# Patient Record
Sex: Male | Born: 1995
Health system: Southern US, Community
[De-identification: ages and names within clinical notes are randomized; demographics above are authoritative.]

## PROBLEM LIST (undated history)

## (undated) DIAGNOSIS — R011 Cardiac murmur, unspecified: Secondary | ICD-10-CM

## (undated) DIAGNOSIS — J45909 Unspecified asthma, uncomplicated: Secondary | ICD-10-CM

## (undated) DIAGNOSIS — T7840XA Allergy, unspecified, initial encounter: Secondary | ICD-10-CM

## (undated) HISTORY — PX: TONSILLECTOMY: SUR1361

## (undated) HISTORY — DX: Allergy, unspecified, initial encounter: T78.40XA

---
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
## 2015-04-18 ENCOUNTER — Emergency Department (HOSPITAL_COMMUNITY)
Admission: EM | Admit: 2015-04-18 | Discharge: 2015-04-18 | Disposition: A | Discharge status: Home / Self Care | Payer: BLUE CROSS/BLUE SHIELD | Attending: Emergency Medicine | Admitting: Emergency Medicine

## 2015-04-18 ENCOUNTER — Emergency Department (HOSPITAL_COMMUNITY): Payer: BLUE CROSS/BLUE SHIELD

## 2015-04-18 ENCOUNTER — Encounter (HOSPITAL_COMMUNITY): Payer: Self-pay | Admitting: Emergency Medicine

## 2015-04-18 DIAGNOSIS — R0981 Nasal congestion: Secondary | ICD-10-CM | POA: Diagnosis not present

## 2015-04-18 DIAGNOSIS — R011 Cardiac murmur, unspecified: Secondary | ICD-10-CM | POA: Diagnosis not present

## 2015-04-18 DIAGNOSIS — H6691 Otitis media, unspecified, right ear: Secondary | ICD-10-CM | POA: Insufficient documentation

## 2015-04-18 DIAGNOSIS — R05 Cough: Secondary | ICD-10-CM | POA: Diagnosis not present

## 2015-04-18 DIAGNOSIS — J45909 Unspecified asthma, uncomplicated: Secondary | ICD-10-CM | POA: Diagnosis not present

## 2015-04-18 DIAGNOSIS — H9201 Otalgia, right ear: Secondary | ICD-10-CM | POA: Diagnosis present

## 2015-04-18 MED ORDER — AMOXICILLIN 500 MG PO CAPS: 500.0000 mg | ORAL_CAPSULE | Freq: Three times a day (TID) | ORAL | Status: DC

## 2015-04-18 NOTE — ED Provider Notes (Signed)
CSN: [id number]     Arrival date & time 04/18/15  1446 History  By signing my name below, I, Todd Sparks, attest that this documentation has been prepared under the direction and in the presence of Todd Sparks, Beckett RidgePA-C. Electronically Signed: Placido SouLogan Sparks, ED Scribe. 04/18/2015. 4:32 PM.    Chief Complaint  Patient presents with  . Cough   The history is provided by the patient. No language interpreter was used.    HPI Comments: Todd Sparks is a 20 y.o. male with a hx of asthma who presents to the Emergency Department complaining of a constant, moderate, right ear pain with onset last night. Pt notes associated cold like symptoms which began about 1 week ago including an unproductive cough, chest congestion and post nasal drip. He denies any hx of smoking but endorses smoke exposure due to working in [employer]. Pt denies any known drug allergies. He denies fevers or other associated symptoms at this time.    Past Medical History  Diagnosis Date  . Allergy   . Murmur, heart     [facility] states [facility] [place] 1st year of life due to murmur  . Asthma    History reviewed. No pertinent past surgical history. No family history on file. Social History  Substance Use Topics  . Smoking status: Never Smoker   . Smokeless tobacco: Never Used  . Alcohol Use: No    Review of Systems  Constitutional: Negative for fever.  HENT: Positive for congestion, ear pain, hearing loss and postnasal drip.   All other systems reviewed and are negative.   Allergies  Bee venom  Home Medications   Prior to Admission medications   Medication Sig Start Date End Date Taking? Authorizing Provider  azithromycin (ZITHROMAX Z-PAK) 250 MG tablet As directed 07/06/14   Mary-Margaret Daphine DeutscherMartin, FNP  benzonatate (TESSALON) 200 MG capsule Take 1 capsule (200 mg total) by mouth 3 (three) times daily as needed. 07/01/14   Junie Spencerhristy A Hawks, FNP  HYDROcodone-homatropine  (HYCODAN) 5-1.5 MG/5ML syrup Take 5 mLs by mouth every 8 (eight) hours as needed for cough. 07/01/14   Christy A Hawks, FNP   BP 154/83 mmHg  Pulse 60  Temp(Src) 98 F (36.7 C) (Oral)  Resp 18  Ht 5\' 5"  (1.651 m)  Wt 160 lb (72.576 kg)  BMI 26.63 kg/m2  SpO2 97%    Physical Exam  Constitutional: He is oriented to person, place, and time. He appears well-developed and well-nourished.  HENT:  Head: Normocephalic and atraumatic.  Right Ear: Tympanic membrane is erythematous and bulging.  Left Ear: Hearing, tympanic membrane, external ear and ear canal normal.  Mouth/Throat: No oropharyngeal exudate.  Neck: Normal range of motion. No tracheal deviation present.  Cardiovascular: Normal rate.   Pulmonary/Chest: Effort normal and breath sounds normal. No respiratory distress. He has no wheezes. He has no rales.  Abdominal: Soft. There is no tenderness.  Musculoskeletal: Normal range of motion.  Neurological: He is alert and oriented to person, place, and time.  Skin: Skin is warm and dry. He is not diaphoretic.  Psychiatric: He has a normal mood and affect. His behavior is normal.  Nursing note and vitals reviewed.   ED Course  Procedures  DIAGNOSTIC STUDIES: Oxygen Saturation is 97% on RA, normal by my interpretation.    COORDINATION OF CARE: 4:28 PM Pt presents today due to right ear pain. Discussed next steps with pt and he agreed to the plan.  Labs Review Labs Reviewed - No data to display  Imaging Review Dg Chest 2 View  04/18/2015  CLINICAL DATA:  COUGH, Pt reports cough, ear pain, chest congestion, and pain with coughing. Denies fever. HISTORY OF ALLERGIES, HEART MURMUR, ASTHMA EXAM: CHEST  2 VIEW COMPARISON:  07/06/2014 FINDINGS: Midline trachea. Normal heart size and mediastinal contours. Mild right hemidiaphragm elevation. No pleural effusion or pneumothorax. Clearing of retrocardiac left lower lobe airspace disease. Clear lungs. IMPRESSION: No acute cardiopulmonary  disease. Electronically Signed   By: Todd Sparks M.D.   On: 04/18/2015 15:09   I have personally reviewed and evaluated these images as part of my medical decision-making.   EKG Interpretation None      MDM   Final diagnoses:  Acute right otitis media, recurrence not specified, unspecified otitis media type   . Meds ordered this encounter  Medications  . amoxicillin (AMOXIL) 500 MG capsule    Sig: Take 1 capsule (500 mg total) by mouth 3 (three) times daily.    Dispense:  30 capsule    Refill:  0    Order Specific Question:  Supervising Provider    Answer:  Todd Sparks [[id number]]   An After Visit Summary was printed and given to the patient.  I personally performed the services in this documentation, which was scribed in my presence.  The recorded information has been reviewed and considered.   Todd Sparks.  Todd Sparks [place], PA-C 04/18/15 1635  Todd Bale, MD 04/19/15 1140

## 2015-04-18 NOTE — ED Notes (Addendum)
Pt reports cough, ear pain, chest congestion, and pain with coughing. Denies fever.

## 2015-04-18 NOTE — Discharge Instructions (Signed)

## 2015-05-14 ENCOUNTER — Ambulatory Visit (INDEPENDENT_AMBULATORY_CARE_PROVIDER_SITE_OTHER): Payer: BLUE CROSS/BLUE SHIELD | Admitting: Nurse Practitioner

## 2015-05-14 ENCOUNTER — Encounter: Payer: Self-pay | Admitting: Nurse Practitioner

## 2015-05-14 DIAGNOSIS — H65191 Other acute nonsuppurative otitis media, right ear: Secondary | ICD-10-CM | POA: Diagnosis not present

## 2015-05-14 MED ORDER — CEFDINIR 300 MG PO CAPS: 300.0000 mg | ORAL_CAPSULE | Freq: Two times a day (BID) | ORAL | Status: DC

## 2015-05-14 NOTE — Patient Instructions (Signed)

## 2015-05-14 NOTE — Progress Notes (Signed)
   Subjective:    Patient ID: Todd Sparks, male    DOB: 10/24/1995, 20 y.o.   MRN: [id number]  HPI  Patient in c/o right ear pain- in January 1,2017 went to [facility] and was treated with amoxicillin- got better- started hurting again this past Monday- hearing is muffled. No ear drainage.   Review of Systems  Constitutional: Negative for fever and chills.  HENT: Negative.   Respiratory: Negative.   Cardiovascular: Negative.   Gastrointestinal: Negative.   Genitourinary: Negative.   Neurological: Positive for dizziness (occasional).  Psychiatric/Behavioral: Negative.   All other systems reviewed and are negative.      Objective:   Physical Exam  Constitutional: He appears well-developed and well-nourished.  HENT:  Right Ear: Tympanic membrane is erythematous and bulging. A middle ear effusion is present.  Left Ear: Hearing, tympanic membrane, external ear and ear canal normal.  Nose: Nose normal.  Mouth/Throat: Uvula is midline, oropharynx is clear and moist and mucous membranes are normal.  Cardiovascular: Normal rate, regular rhythm and normal heart sounds.   Pulmonary/Chest: Effort normal and breath sounds normal.   BP 115/72 mmHg  Pulse 57  Temp(Src) 98 F (36.7 C) (Oral)  Ht  (1.651 m)  Wt 155 lb (70.308 kg)  BMI 25.79 kg/m2       Assessment & Plan:  1 Otiitis Media right heating pad will help motirn or tylenol OTC RTO prn - cefdinir (OMNICEF) 300 MG capsule; Take 1 capsule (300 mg total) by mouth 2 (two) times daily. 1 po BID  Dispense: 20 capsule; Refill: 0   Mary-Margaret Daphine Deutscher, FNP

## 2015-07-05 ENCOUNTER — Ambulatory Visit (INDEPENDENT_AMBULATORY_CARE_PROVIDER_SITE_OTHER): Payer: BLUE CROSS/BLUE SHIELD | Admitting: Nurse Practitioner

## 2015-07-05 ENCOUNTER — Ambulatory Visit (INDEPENDENT_AMBULATORY_CARE_PROVIDER_SITE_OTHER): Payer: BLUE CROSS/BLUE SHIELD

## 2015-07-05 VITALS — BP 128/79 | HR 75 | Temp 97.8°F | Ht 65.0 in | Wt 186.0 lb

## 2015-07-05 DIAGNOSIS — R059 Cough, unspecified: Secondary | ICD-10-CM

## 2015-07-05 DIAGNOSIS — R05 Cough: Secondary | ICD-10-CM

## 2015-07-05 DIAGNOSIS — R0600 Dyspnea, unspecified: Secondary | ICD-10-CM | POA: Diagnosis not present

## 2015-07-05 DIAGNOSIS — R071 Chest pain on breathing: Secondary | ICD-10-CM

## 2015-07-05 DIAGNOSIS — R079 Chest pain, unspecified: Secondary | ICD-10-CM

## 2015-07-05 DIAGNOSIS — R0789 Other chest pain: Secondary | ICD-10-CM

## 2015-07-05 MED ORDER — NAPROXEN 500 MG PO TABS: 500.0000 mg | ORAL_TABLET | Freq: Two times a day (BID) | ORAL | Status: DC

## 2015-07-05 NOTE — Progress Notes (Signed)
   Subjective:    Patient ID: Todd Sparks, male    DOB: 01-Nov-1995, 20 y.o.   MRN: [id number]  HPI Patient woke up this morning with chest pains- has history of asthma and thought was from that. Chest pain is pressure like something is sittting on chest and radiates into back. Some SOB- no fever, cough or congestion.    Review of Systems  Constitutional: Negative.  Negative for fever, chills and appetite change.  HENT: Negative.  Negative for congestion, ear pain, sinus pressure, trouble swallowing and voice change.   Respiratory: Positive for shortness of breath. Negative for cough and wheezing.   Cardiovascular: Positive for chest pain. Negative for palpitations and leg swelling.  Gastrointestinal: Negative.   Genitourinary: Negative.   Neurological: Negative.   Psychiatric/Behavioral: Negative.   All other systems reviewed and are negative.      Objective:   Physical Exam  Constitutional: He is oriented to person, place, and time. He appears well-developed and well-nourished. No distress.  Cardiovascular: Normal rate, regular rhythm and normal heart sounds.   Pulmonary/Chest: Effort normal and breath sounds normal.  Musculoskeletal:  No chest pain on palpation  Neurological: He is alert and oriented to person, place, and time.  Skin: Skin is warm and dry.  Psychiatric: He has a normal mood and affect. His behavior is normal. Judgment and thought content normal.    BP 128/79 mmHg  Pulse 75  Temp(Src) 97.8 F (36.6 C) (Oral)  Ht 5\' 5"  (1.651 m)  Wt 186 lb (84.369 kg)  BMI 30.95 kg/m2  Chest x ray- normal-Preliminary reading by Paulene FloorMary Ferris Tally, FNP  [facility] EKG-     Assessment & Plan:   1. Dyspnea   2. Cough   3. Chest pain, unspecified chest pain type   4. Costochondral pain    Meds ordered this encounter  Medications  . naproxen (NAPROSYN) 500 MG tablet    Sig: Take 1 tablet (500 mg total) by mouth 2 (two) times daily with a meal.    Dispense:  60 tablet   Refill:  1    Order Specific Question:  Supervising Provider    Answer:  Ernestina PennaMOORE, DONALD W [[id number]]   Rest No heavy liftiong RTO prn  Mary-Margaret Daphine DeutscherMartin, FNP

## 2015-07-05 NOTE — Patient Instructions (Signed)

## 2015-09-09 ENCOUNTER — Encounter: Payer: Self-pay | Admitting: Family Medicine

## 2015-09-09 ENCOUNTER — Ambulatory Visit (INDEPENDENT_AMBULATORY_CARE_PROVIDER_SITE_OTHER): Payer: BLUE CROSS/BLUE SHIELD | Admitting: Family Medicine

## 2015-09-09 VITALS — BP 141/90 | HR 76 | Temp 97.5°F | Ht 65.02 in | Wt 190.6 lb

## 2015-09-09 DIAGNOSIS — F39 Unspecified mood [affective] disorder: Secondary | ICD-10-CM

## 2015-09-09 MED ORDER — CITALOPRAM HYDROBROMIDE 20 MG PO TABS
20.0000 mg | ORAL_TABLET | Freq: Every day | ORAL | Status: DC
Start: 2015-09-09 — End: 2015-10-12

## 2015-09-09 NOTE — Patient Instructions (Signed)
Great to meet you!  I am starting you on celexa, take 1/2 pill daily for 2 weeks then increase to 1 pill daily  Come back in 3-4 weeks for follow up   Taking the medicine as directed and not missing any doses is one of the best things you can do to treat your depression.  Here are some things to keep in mind:  1) Side effects (stomach upset, some increased anxiety) may happen before you notice a benefit.  These side effects typically go away over time. 2) Changes to your dose of medicine or a change in medication all together is sometimes necessary 3) Most people need to be on medication at least 6-12 months 4) Many people will notice an improvement within two weeks but the full effect of the medication can take up to 4-6 weeks 5) Stopping the medication when you start feeling better often results in a return of symptoms 6) If you start having thoughts of hurting yourself or others after starting this medicine, please call 911 immediately

## 2015-09-09 NOTE — Progress Notes (Addendum)
   HPI  Patient presents today here with anxiety and depression.  Patient explains that he's had a few panic attacks recently characterized by racing heart and extreme feelings of anxiety. This started in February, 3 months ago but has become more frequent. Many of his anxiety attacks and anxious feelings have been around going to work. He works at [employer] doing several different jobs  2 separate times he's [place] and thought about letting go of the steering wheel, he cannot really explain why he did not commit suicide but denies SI currently. It has been over a month since he had these thoughts He has feelings of anxiety, trouble falling asleep sometimes, decreased energy, feeling bad about himself  He does not want any medications that are addictive, he has family members with addiction issues. He also wants to keep this problem private from his stepmother  PMH: Smoking status noted No family history of bipolar disorder ROS: Per HPI  Objective: BP 141/90 mmHg  Pulse 76  Temp(Src) 97.5 F (36.4 C) (Oral)  Ht 5' 5.02" (1.652 m)  Wt 190 lb 9.6 oz (86.456 kg)  BMI 31.68 kg/m2 Gen: NAD, alert, cooperative with exam HEENT: NCAT, EOMI, PERRLA CV: RRR, good S1/S2, no murmur Resp: CTABL, no wheezes, non-labored Abd: SNTND, BS present, no guarding or organomegaly Ext: No edema, warm Neuro: Alert and oriented, No gross deficits  Psych Appropriate mood and affect, denies suicidal thoughts currently but has had some recently as described above  GAD 7 : Generalized Anxiety Score 09/09/2015  Nervous, Anxious, on Edge 3  Control/stop worrying 3  Worry too much - different things 3  Trouble relaxing 3  Restless 3  Easily annoyed or irritable 3  Afraid - awful might happen 3  Total GAD 7 Score 21  Anxiety Difficulty Somewhat difficult    PHQ-9 = 16, 1 on #9  Assessment and plan:  # Mood disorder Combined anxiety and depression Starting Celexa, 10 mg  for 2 weeks then 20 mg daily until f/u Denies suicidal thoughts currently, I did discuss increased risk of suicidal thoughts with medication Return to clinic in one month  Recommended therapists/psychologist using [email or web address] to find one     Meds ordered this encounter  Medications  . citalopram (CELEXA) 20 MG tablet    Sig: Take 1 tablet (20 mg total) by mouth daily.    Dispense:  30 tablet    Refill:  1    Murtis SinkSam Raenell Mensing, MD [facility] 09/09/2015, 11:04 AM

## 2015-10-12 ENCOUNTER — Encounter: Payer: Self-pay | Admitting: Family Medicine

## 2015-10-12 ENCOUNTER — Ambulatory Visit (INDEPENDENT_AMBULATORY_CARE_PROVIDER_SITE_OTHER): Payer: BLUE CROSS/BLUE SHIELD | Admitting: Family Medicine

## 2015-10-12 VITALS — BP 138/85 | HR 73 | Temp 98.0°F | Ht 65.03 in | Wt 190.6 lb

## 2015-10-12 DIAGNOSIS — F39 Unspecified mood [affective] disorder: Secondary | ICD-10-CM | POA: Diagnosis not present

## 2015-10-12 MED ORDER — CITALOPRAM HYDROBROMIDE 40 MG PO TABS: 40.0000 mg | ORAL_TABLET | Freq: Every day | ORAL | Status: DC

## 2015-10-12 NOTE — Patient Instructions (Signed)
Great to see you!  I have increased to celexa dose to 40 mg, this is the usual dose, I go up slowly to make sure you tolerate it well.   Please come back in 6-8 weeks for followup

## 2015-10-12 NOTE — Progress Notes (Signed)
   HPI  Patient presents today here to follow-up for depression and anxiety.  Patient started Celexa last month. He states that overall he feels better. He had minimal GI upset for 2-3 days whenever he first started it has completely resolved.  He states that his anxiety is improved, his depression is slightly better. He states that his suicidal thoughts have improved as well. He states that he still gets occasional feelings of "being better off dead", however he denies any plans to hurt himself and states that he doesn't think about how he would do it anymore.  He states that he was previously drinking 1 alcoholic drink 2 or 3 nights a week previous to starting the medication, he has stopped completely since that time.  He is sexually active, no sexual side effects.  PMH: Smoking status noted ROS: Per HPI  Objective: BP 138/85 mmHg  Pulse 73  Temp(Src) 98 F (36.7 C) (Oral)  Ht 5' 5.03" (1.652 m)  Wt 190 lb 9.6 oz (86.456 kg)  BMI 31.68 kg/m2 Gen: NAD, alert, cooperative with exam HEENT: NCAT CV: RRR, good S1/S2, no murmur Resp: CTABL, no wheezes, non-labored Ext: No edema, warm Neuro: Alert and oriented, No gross deficits  Psych: Denies suicidal ideation, does have passive thoughts as explained above,: Contracts for safety Appropriate mood and affect.  Depression screen [facility] 2/9 10/12/2015 09/09/2015  Decreased Interest 2 1  Down, Depressed, Hopeless 2 2  PHQ - 2 Score 4 3  Altered sleeping 2 2  Tired, decreased energy 2 2  Change in appetite 1 3  Feeling bad or failure about yourself  2 2  Trouble concentrating 2 3  Moving slowly or fidgety/restless 3 3  Suicidal thoughts 2 1  PHQ-9 Score 18 19    GAD 7 : Generalized Anxiety Score 10/12/2015 09/09/2015  Nervous, Anxious, on Edge 3 3  Control/stop worrying 3 3  Worry too much - different things 3 3  Trouble relaxing 3 3  Restless 3 3  Easily annoyed or irritable 2 3  Afraid - awful might happen 3 3  Total GAD 7  Score 20 21  Anxiety Difficulty Somewhat difficult Somewhat difficult     Assessment and plan:  # Mood disorder, neck depression and anxiety Improving on Celexa, he's tolerating easily. He still has passive suicidal thoughts, however he states that these having less suicidal ideation and previous and he contracts for safety. Increase to 40 mg Follow-up 6-8 weeks.    Meds ordered this encounter  Medications  . citalopram (CELEXA) 40 MG tablet    Sig: Take 1 tablet (40 mg total) by mouth daily.    Dispense:  30 tablet    Refill:  2    Murtis SinkSam Jevin Camino, MD [facility] 10/12/2015, 11:15 AM

## 2015-10-28 ENCOUNTER — Other Ambulatory Visit: Payer: Self-pay

## 2015-10-28 MED ORDER — CITALOPRAM HYDROBROMIDE 40 MG PO TABS: 40.0000 mg | ORAL_TABLET | Freq: Every day | ORAL | Status: DC

## 2015-11-29 ENCOUNTER — Other Ambulatory Visit: Payer: Self-pay | Admitting: *Deleted

## 2015-11-29 MED ORDER — CITALOPRAM HYDROBROMIDE 40 MG PO TABS: 40.0000 mg | ORAL_TABLET | Freq: Every day | ORAL | 0 refills | Status: DC

## 2015-12-03 ENCOUNTER — Encounter: Payer: Self-pay | Admitting: Family Medicine

## 2015-12-03 ENCOUNTER — Ambulatory Visit (INDEPENDENT_AMBULATORY_CARE_PROVIDER_SITE_OTHER): Payer: BLUE CROSS/BLUE SHIELD | Admitting: Family Medicine

## 2015-12-03 VITALS — BP 119/63 | HR 67 | Temp 98.0°F | Ht 65.0 in | Wt 195.0 lb

## 2015-12-03 DIAGNOSIS — F39 Unspecified mood [affective] disorder: Secondary | ICD-10-CM

## 2015-12-03 MED ORDER — CITALOPRAM HYDROBROMIDE 40 MG PO TABS: 40.0000 mg | ORAL_TABLET | Freq: Every day | ORAL | 1 refills | Status: DC

## 2015-12-03 NOTE — Patient Instructions (Signed)
Great to see you!  Lets follow up in 4 months  Look into getting counseling with [facility]

## 2015-12-03 NOTE — Progress Notes (Addendum)
   HPI  Patient presents today here to follow-up for depression and anxiety.  Patient states that over the last 2 months or so he has had improvement in anxiety and depression, however he has had several life stressors that are very stressful. He was kicked out of his house and then let back in, he lost his job and then another one.  He starting college, and rocking him. He college, and he has a friend that was planning to kill herself.  He states that he's had some passive thoughts of being better off dead over a month ago, recently he's been doing much better. He denies having any plan to hurt himself and continues to agree to reach out for help if he does have plans to hurt himself.  He denies any concerns about side effects,, he denies any diarrhea or upset stomach  PMH: Smoking status noted ROS: Per HPI  Objective: BP 119/63   Pulse 67   Temp 98 F (36.7 C) (Oral)   Ht 5\' 5"  (1.651 m)   Wt 195 lb (88.5 kg)   BMI 32.45 kg/m  Gen: NAD, alert, cooperative with exam HEENT: NCAT CV: RRR, good S1/S2, no murmur Resp: CTABL, no wheezes, non-labored Ext: No edema, warm Neuro: Alert and oriented  Psych: appropriate mood and affect, Denies SI, contracts for safety with recent passive SI  Depression [facility] 12/03/2015 10/12/2015 09/09/2015  Decreased Interest - 2 1  Down, Depressed, Hopeless 2 2 2   PHQ - 2 Score 2 4 3   Altered sleeping 1 2 2   Tired, decreased energy 2 2 2   Change in appetite 2 1 3   Feeling bad or failure about yourself  - 2 2  Trouble concentrating 2 2 3   Moving slowly or fidgety/restless 3 3 3   Suicidal thoughts 1 2 1   PHQ-9 Score 13 18 19   Difficult doing work/chores Very difficult - -   GAD 7 : Generalized Anxiety Score 12/03/2015 10/12/2015 09/09/2015  Nervous, Anxious, on Edge 3 3 3   Control/stop worrying 3 3 3   Worry too much - different things 3 3 3   Trouble relaxing 2 3 3   Restless 2 3 3   Easily annoyed or irritable 2 2 3   Afraid - awful might  happen 2 3 3   Total GAD 7 Score 17 20 21   Anxiety Difficulty Somewhat difficult Somewhat difficult Somewhat difficult       Assessment and plan:  # Depression and anxiety, Mood disorder Improving slowly, patient still having passive suicidal thoughts, however he denies any thoughts over the last 2-4 weeks. He's tolerating medication easily. Refill Celexa Follow-up 4 months.  He continues to contract for safety and states that he would not hurt himself.     Meds ordered this encounter  Medications  . citalopram (CELEXA) 40 MG tablet    Sig: Take 1 tablet (40 mg total) by mouth daily.    Dispense:  90 tablet    Refill:  1    Murtis SinkSam Ermel Verne, MD [facility] 12/03/2015, 1:34 PM

## 2016-04-07 ENCOUNTER — Ambulatory Visit: Payer: BLUE CROSS/BLUE SHIELD | Admitting: Family Medicine

## 2016-07-07 ENCOUNTER — Encounter: Payer: Self-pay | Admitting: Family Medicine

## 2016-07-07 ENCOUNTER — Ambulatory Visit (INDEPENDENT_AMBULATORY_CARE_PROVIDER_SITE_OTHER): Payer: BLUE CROSS/BLUE SHIELD | Admitting: Family Medicine

## 2016-07-07 VITALS — BP 124/77 | HR 60 | Temp 96.9°F | Ht 65.0 in | Wt 189.2 lb

## 2016-07-07 DIAGNOSIS — F39 Unspecified mood [affective] disorder: Secondary | ICD-10-CM | POA: Diagnosis not present

## 2016-07-07 MED ORDER — CITALOPRAM HYDROBROMIDE 40 MG PO TABS: 40.0000 mg | ORAL_TABLET | Freq: Every day | ORAL | 1 refills | Status: DC

## 2016-07-07 NOTE — Patient Instructions (Signed)
Great to see you!  Start with 1/2 tab for 2 weeks then increase to 1 tab daily.   Lets plan on seeing you again in 3 months.   Taking the medicine as directed and not missing any doses is one of the best things you can do to treat your depression.  Here are some things to keep in mind:  1) Side effects (stomach upset, some increased anxiety) may happen before you notice a benefit.  These side effects typically go away over time. 2) Changes to your dose of medicine or a change in medication all together is sometimes necessary 3) Most people need to be on medication at least 6-12 months 4) Many people will notice an improvement within two weeks but the full effect of the medication can take up to 4-6 weeks 5) Stopping the medication when you start feeling better often results in a return of symptoms 6) If you start having thoughts of hurting yourself or others after starting this medicine, please call me at [phone] or call 911

## 2016-07-07 NOTE — Progress Notes (Signed)
   HPI  Patient presents today  Here to follow-up for mood disorder.  Patient explains that he's had a return of anxiety and depression since stopping medications about 2 months ago. He states that about one week after the medication was stopped he can tell a big difference in decrease in his mood.  He had difficulty affording the visits to come in.  He states that he's had one suicidal thoughts last month, he contracts for safety and states that he does not have any thoughts of hurting himself today.  Patient has a girlfriend and that's going well, she agrees that he needs the medication.  PMH: Smoking status noted ROS: Per HPI  Objective: BP 124/77   Pulse 60   Temp (!) 96.9 F (36.1 C) (Oral)   Ht 5\' 5"  (1.651 m)   Wt 189 lb 3.2 oz (85.8 kg)   BMI 31.48 kg/m  Gen: NAD, alert, cooperative with exam HEENT: NCAT CV: RRR, good S1/S2, no murmur Resp: CTABL, no wheezes, non-labored Ext: No edema, warm Neuro: Alert and oriented, No gross deficits  [facility] 07/07/2016 12/03/2015 10/12/2015 09/09/2015  Decreased Interest 2 - 2 1  Down, Depressed, Hopeless 2 2 2 2   PHQ - 2 Score 4 2 4 3   Altered sleeping 3 1 2 2   Tired, decreased energy 3 2 2 2   Change in appetite 1 2 1 3   Feeling bad or failure about yourself  1 - 2 2  Trouble concentrating 3 2 2 3   Moving slowly or fidgety/restless 3 3 3 3   Suicidal thoughts 1 1 2 1   PHQ-9 Score 19 13 18 19   Difficult doing work/chores Very difficult Very difficult - -   GAD 7 : Generalized Anxiety Score 07/07/2016 12/03/2015 10/12/2015 09/09/2015  Nervous, Anxious, on Edge 3 3 3 3   Control/stop worrying 3 3 3 3   Worry too much - different things 3 3 3 3   Trouble relaxing 2 2 3 3   Restless 3 2 3 3   Easily annoyed or irritable 3 2 2 3   Afraid - awful might happen 3 2 3 3   Total GAD 7 Score 20 17 20 21   Anxiety Difficulty Very difficult Somewhat difficult Somewhat difficult Somewhat difficult      Assessment and  plan:  # Mood disorder mixed mood disorder with anxiety and depression Patient contracts for safety, he ahd an SI about 1 month ago. Denies SI today.  States previously the medication was very helpful.     Meds ordered this encounter  Medications  . citalopram (CELEXA) 40 MG tablet    Sig: Take 1 tablet (40 mg total) by mouth daily.    Dispense:  90 tablet    Refill:  1    Murtis SinkSam Lemya Greenwell, MD [facility] 07/07/2016, 8:33 AM

## 2016-09-07 IMAGING — CR DG CHEST 2V
2 series · 2 of 2 positions shown · non-contrast
Comparison: 04/18/2015

CLINICAL DATA: Chest pain

EXAM:
CHEST  2 VIEW

[view not recorded (1 of 2)]
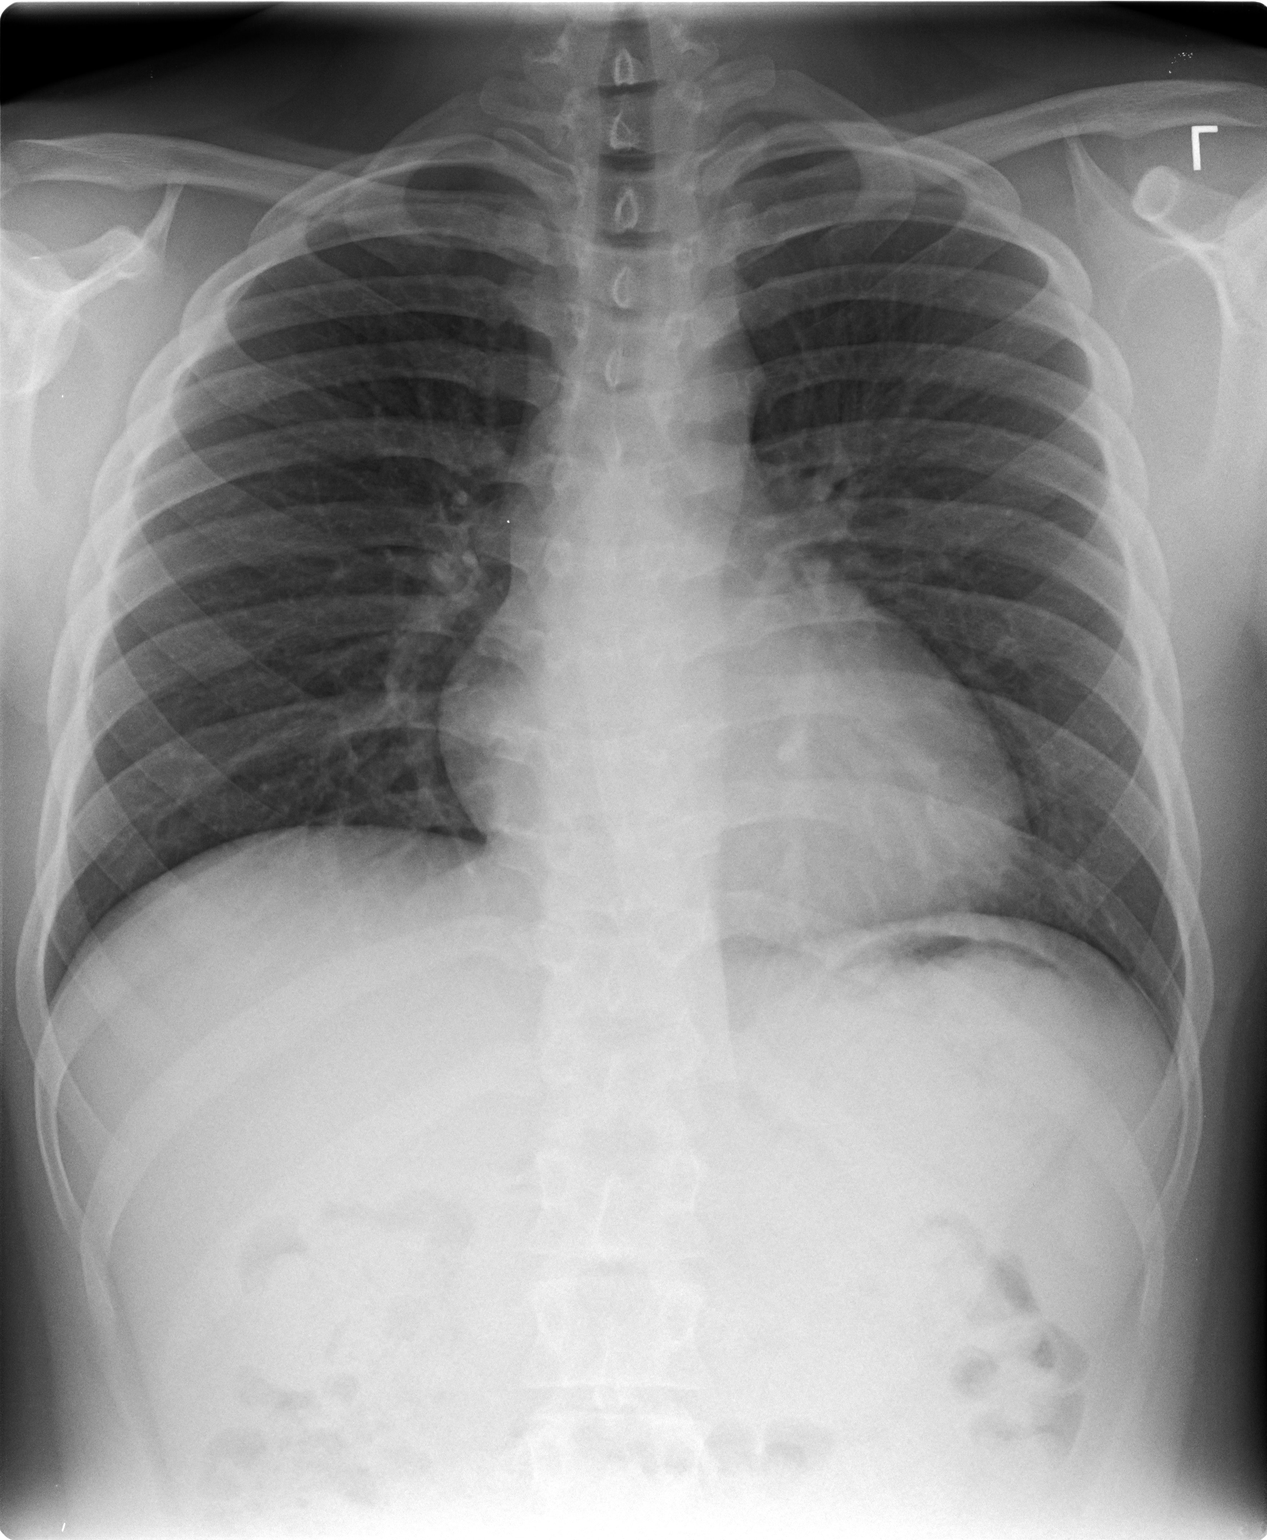

[view not recorded (2 of 2)]
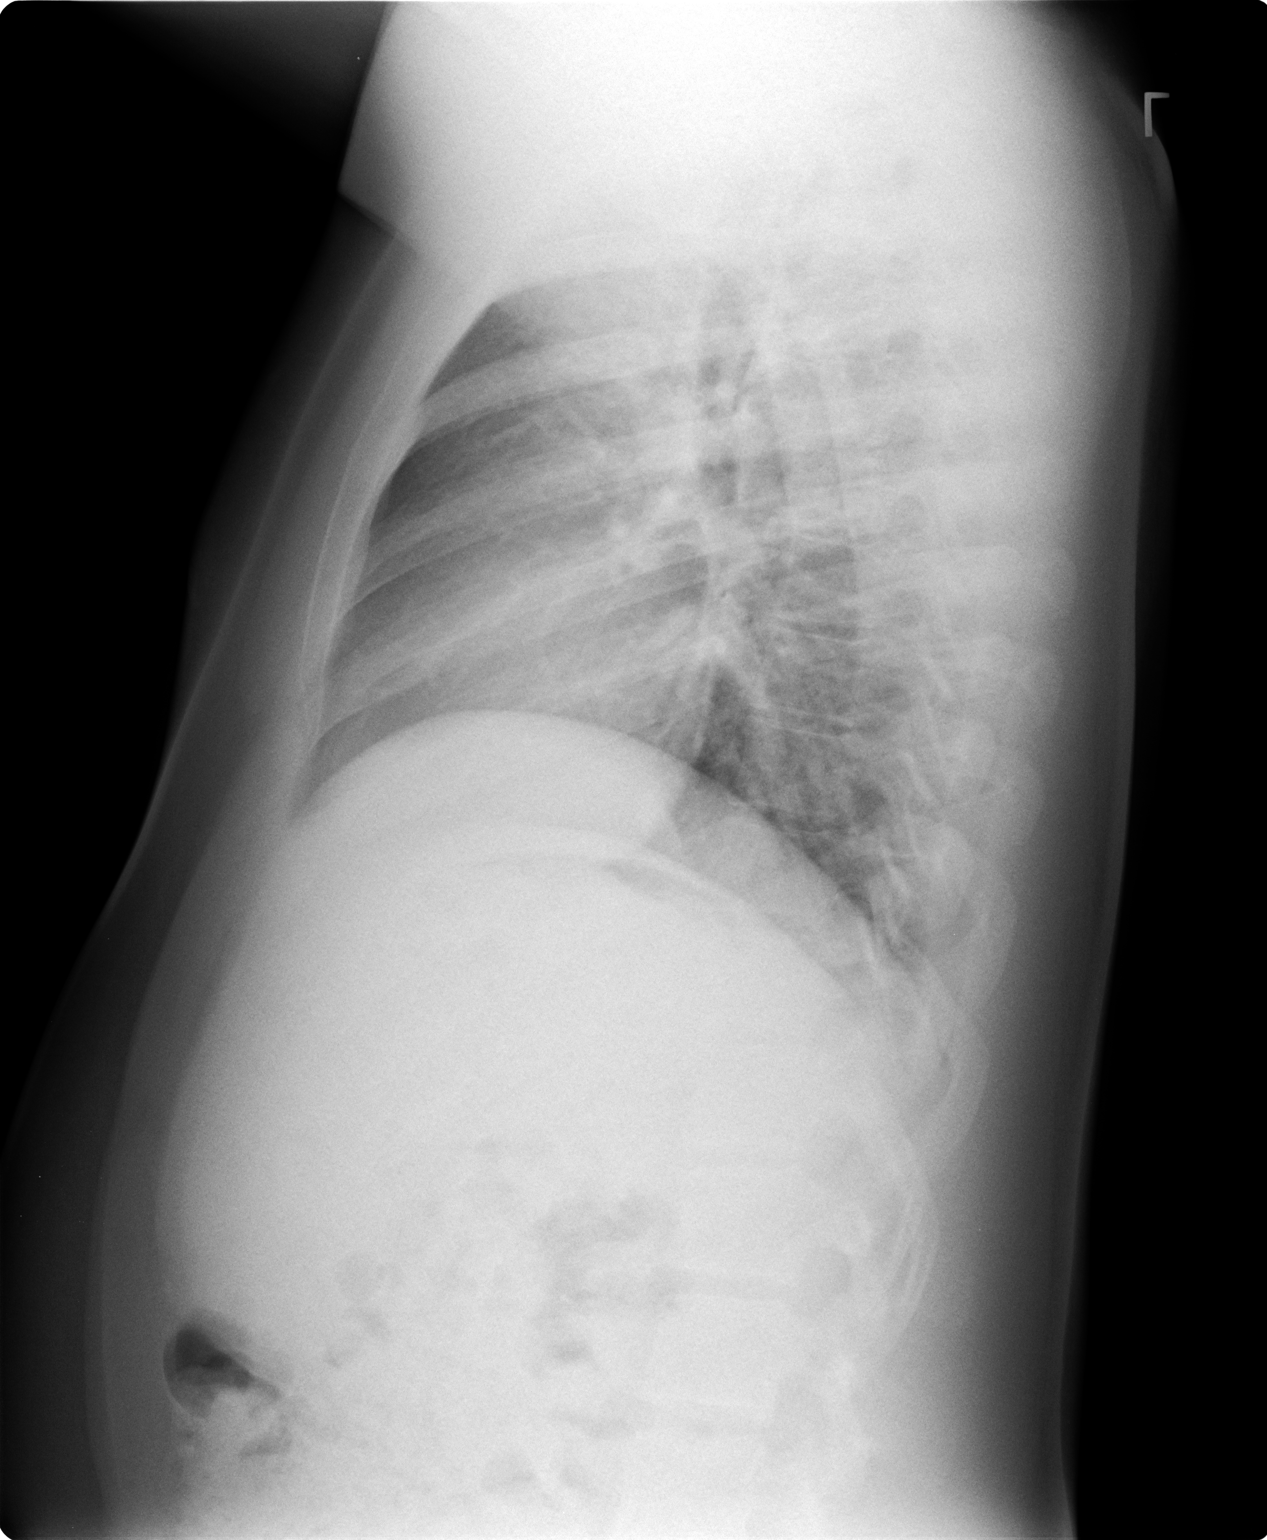

[2 of 2 positions shown; findings below may reference images not displayed]

FINDINGS: The heart size and mediastinal contours are within normal limits.
Both lungs are clear. The visualized skeletal structures are
unremarkable.
IMPRESSION: No active cardiopulmonary disease.

## 2016-10-10 ENCOUNTER — Encounter: Payer: Self-pay | Admitting: Family Medicine

## 2016-10-10 ENCOUNTER — Ambulatory Visit (INDEPENDENT_AMBULATORY_CARE_PROVIDER_SITE_OTHER): Payer: BLUE CROSS/BLUE SHIELD | Admitting: Family Medicine

## 2016-10-10 VITALS — BP 128/82 | HR 53 | Temp 97.2°F | Ht 65.0 in | Wt 184.6 lb

## 2016-10-10 DIAGNOSIS — F39 Unspecified mood [affective] disorder: Secondary | ICD-10-CM

## 2016-10-10 MED ORDER — CITALOPRAM HYDROBROMIDE 40 MG PO TABS: 40.0000 mg | ORAL_TABLET | Freq: Every day | ORAL | 3 refills | Status: DC

## 2016-10-10 NOTE — Progress Notes (Signed)
   HPI  Patient presents today here to follow-up for mood disorder.  Patient had some mixed anxiety and depression. He has now been taking full dose citalopram for 2-3 months. He is tolerating medication well and states that his mood is improved.  He denies any suicidal thoughts. He previously was having passive suicidal ideation, however he has never hesitated to contract for safety. At this time he states he is doing much better. He is doing well at work. He exercises regularly, he works at a [employer].  He watches his diet moderately.  PMH: Smoking status noted ROS: Per HPI  Objective: BP 128/82   Pulse (!) 53   Temp 97.2 F (36.2 C) (Oral)   Ht 5\' 5"  (1.651 m)   Wt 184 lb 9.6 oz (83.7 kg)   BMI 30.72 kg/m  Gen: NAD, alert, cooperative with exam HEENT: NCAT CV: RRR, good S1/S2, no murmur Resp: CTABL, no wheezes, non-labored Ext: No edema, warm Neuro: Alert and oriented, No gross deficits  Assessment and plan:  # Mood disorder Improved, continue Celexa 40 mg Discussed counseling, [facility] neded Follow-up 6 months.   Murtis SinkSam Donley Harland, MD [facility] 10/10/2016, 8:49 AM

## 2017-01-05 ENCOUNTER — Encounter: Payer: Self-pay | Admitting: *Deleted

## 2017-02-16 ENCOUNTER — Other Ambulatory Visit: Payer: Self-pay | Admitting: Family Medicine

## 2017-02-16 NOTE — Telephone Encounter (Signed)
Last seen 10/10/16  Dr Ermalinda MemosBradshaw

## 2017-04-06 ENCOUNTER — Ambulatory Visit: Payer: BLUE CROSS/BLUE SHIELD | Admitting: Family Medicine

## 2017-04-11 ENCOUNTER — Ambulatory Visit: Payer: BLUE CROSS/BLUE SHIELD | Admitting: Family Medicine

## 2017-04-27 ENCOUNTER — Ambulatory Visit: Payer: BLUE CROSS/BLUE SHIELD | Admitting: Family Medicine

## 2017-04-27 ENCOUNTER — Encounter: Payer: Self-pay | Admitting: Family Medicine

## 2017-04-27 VITALS — BP 136/80 | HR 63 | Temp 97.0°F | Ht 65.0 in | Wt 195.2 lb

## 2017-04-27 DIAGNOSIS — F39 Unspecified mood [affective] disorder: Secondary | ICD-10-CM

## 2017-04-27 MED ORDER — CITALOPRAM HYDROBROMIDE 40 MG PO TABS: 40.0000 mg | ORAL_TABLET | Freq: Every day | ORAL | 1 refills | Status: DC

## 2017-04-27 NOTE — Patient Instructions (Signed)
Great to see you!  Come back in 6 months unless you need us sooner.   Keep up the hard work in the gym, you are doing great!

## 2017-04-27 NOTE — Progress Notes (Signed)
   HPI  Patient presents today for follow-up of anxiety.  Patient states that he feels well, he feels the medication is working well. He ran out of medication about 1 week ago and has had some intermittent anxious feeling since that time.  He does note that he has had one recent panic attack after running out of medication.  He denies SI. He has been working out regularly doing cardio and weight lifting.  PMH: Smoking status noted ROS: Per HPI  Objective: BP 136/80   Pulse 63   Temp (!) 97 F (36.1 C) (Oral)   Ht 5\' 5"  (1.651 m)   Wt 195 lb 3.2 oz (88.5 kg)   BMI 32.48 kg/m  Gen: NAD, alert, cooperative with exam HEENT: NCAT CV: RRR, good S1/S2, no murmur Resp: CTABL, no wheezes, non-labored Ext: No edema, warm Neuro: Alert and oriented, No gross deficits  Assessment and plan:  #Mood disorder Mixed depression and anxiety, patient is doing very well with citalopram We discussed his weight gain, however he is working out aggressively and trying to build muscle which could be the source of weight gain. He is conscious of the possibility that the medication could add weight. Follow-up 6 months   Meds ordered this encounter  Medications  . citalopram (CELEXA) 40 MG tablet    Sig: Take 1 tablet (40 mg total) by mouth daily.    Dispense:  90 tablet    Refill:  1    Murtis SinkSam Shaniya Tashiro, MD [facility] 04/27/2017, 11:06 AM

## 2017-08-27 ENCOUNTER — Ambulatory Visit: Payer: BLUE CROSS/BLUE SHIELD | Admitting: Family Medicine

## 2017-08-27 ENCOUNTER — Encounter: Payer: Self-pay | Admitting: Family Medicine

## 2017-08-27 VITALS — BP 138/86 | HR 69 | Temp 98.4°F | Ht 65.0 in | Wt 190.2 lb

## 2017-08-27 DIAGNOSIS — F39 Unspecified mood [affective] disorder: Secondary | ICD-10-CM | POA: Diagnosis not present

## 2017-08-27 MED ORDER — CITALOPRAM HYDROBROMIDE 40 MG PO TABS: 40.0000 mg | ORAL_TABLET | Freq: Every day | ORAL | 1 refills | Status: DC
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
## 2017-08-27 NOTE — Patient Instructions (Signed)
Great to see you!  Come back in 4 months to see Dr. Gottschalk   

## 2017-11-09 ENCOUNTER — Ambulatory Visit: Payer: BLUE CROSS/BLUE SHIELD | Admitting: Family Medicine

## 2017-12-28 ENCOUNTER — Telehealth: Payer: Self-pay

## 2017-12-28 ENCOUNTER — Encounter: Payer: Self-pay | Admitting: Family Medicine

## 2017-12-28 ENCOUNTER — Ambulatory Visit: Payer: BLUE CROSS/BLUE SHIELD | Admitting: Family Medicine

## 2017-12-28 VITALS — BP 121/79 | HR 50 | Temp 98.2°F | Ht 65.0 in | Wt 175.0 lb

## 2017-12-28 DIAGNOSIS — G479 Sleep disorder, unspecified: Secondary | ICD-10-CM | POA: Diagnosis not present

## 2017-12-28 DIAGNOSIS — Z1329 Encounter for screening for other suspected endocrine disorder: Secondary | ICD-10-CM | POA: Diagnosis not present

## 2017-12-28 DIAGNOSIS — Z130 Encounter for screening for diseases of the blood and blood-forming organs and certain disorders involving the immune mechanism: Secondary | ICD-10-CM

## 2017-12-28 DIAGNOSIS — Z113 Encounter for screening for infections with a predominantly sexual mode of transmission: Secondary | ICD-10-CM

## 2017-12-28 DIAGNOSIS — F39 Unspecified mood [affective] disorder: Secondary | ICD-10-CM

## 2017-12-28 MED ORDER — TRAZODONE HCL 50 MG PO TABS: 25.0000 mg | ORAL_TABLET | Freq: Every evening | ORAL | 3 refills | Status: DC | PRN

## 2017-12-28 NOTE — Telephone Encounter (Signed)
[facility] - Left Msg 

## 2017-12-28 NOTE — Patient Instructions (Addendum)
You had labs performed today.  You will be contacted with the results of the labs once they are available, usually in the next 3 business days for routine lab work.   Continue taking the Celexa daily as prescribed.  I have added a medication called trazodone that you may take every night at bedtime.  Start out with 1/2 tablet at bedtime as needed for sleep.  If you are not having significant improvement in sleep and mood after 1 week, you may increase to a full tablet at bedtime as needed for sleep.  You will be receiving a call from the behavioral health counselor via telephone.  Taking the medicine as directed and not missing any doses is one of the best things you can do to treat your depression/anxiety.  Here are some things to keep in mind:  1) Side effects (stomach upset, some increased anxiety) may happen before you notice a benefit.  These side effects typically go away over time. 2) Changes to your dose of medicine or a change in medication all together is sometimes necessary 3) Most people need to be on medication at least 12 months 4) Many people will notice an improvement within two weeks but the full effect of the medication can take up to 4-6 weeks 5) Stopping the medication when you start feeling better often results in a return of symptoms 6) Never discontinue your medication without contacting a health care professional first.  Some medications require gradual discontinuation/ taper and can make you sick if you stop them abruptly.  If your symptoms worsen or you have thoughts of suicide/homicide, PLEASE SEEK IMMEDIATE MEDICAL ATTENTION.  You may always call:  National Suicide Hotline: 681-354-5656 [place] Crisis Line: 346-759-8238 Crisis Recovery in [place]: 4238684162   These are available 24 hours a day, 7 days a week.  Your provider wants you to schedule an appointment with a Psychologist/Psychiatrist. The following list of offices requires the patient to  call and make their own appointment, as there is information they need that only you can provide. Please feel free to choose form the following providers:  [facility]   [phone] Crisis Recovery in [place] [phone]  [facility]  [phone] [place], [place]  (Scheduled through [facility]) Must call and do an interview for appointment. Sees Children / Accepts Medicaid  [facility]    [phone]  [address], [address]    [place], [place]       [facility]  [phone] [address] [place], [place]  Evaluates for Autism but does not treat it Sees Children / Accepts Medicaid  [facility]    [phone] [address]   [place], [place] Medication management, substance abuse, bipolar, grief, family, marriage, OCD, anxiety, PTSD Sees children / Accepts Medicaid  [facility]    [phone] [address] [place], [place] Sees children / Accepts [facility]  [facility]  [phone] [address] [place], [place]   Dr Estelle Grumbles     [phone] [address] [place], [place]  Sees ADD & ADHD for treatment Accepts Medicaid  [facility]  [phone] [address] [place], [place] Evaluates for Autism Accepts [facility]  [facility]  [phone] [address] [place], [place]  Does Adult ADD evaluations Does not accept Medicaid  [facility]   [phone] [address] [place]  [place], [place] Uses animal therapy  Sees children as young as 22 years old [facility]s [facility]  [facility]     [phone]    [address]  [place], [place] [id number] Sees children Accepts Medicaid

## 2017-12-28 NOTE — Progress Notes (Signed)
Subjective: CC: Depression/ Anxiety PCP: Janora Norlander, DO KGM:WNUUVOZ Todd Sparks is a 22 y.o. male presenting to clinic today for:  1. Depression/ Anxiety Patient reports a long-standing history of depression and anxiety.  He notes he had "bad thoughts" start during middle school.  He cites the passing of his father as the instigating factor.  He states that he again had similar thoughts in high school, as a result of bullying, heavy workload at school.  Most recently, depressive thoughts have been worse because he has been jobless for about a month.  He cites financial stressors that are causing quite a bit of symptoms lately.  He notes that the Celexa has been helpful but is not helping as much as he would like.  He reports poor sleep alternating with excessive sleep.  He is very private about his issues and states that he has not sought counseling within the family or outside of the family.  Denies any suicidal intent but does state that he occasionally has suicidal thoughts without plan.  No homicidal ideation.  No visual auditory hallucinations.  He drinks occasionally 1 beer per day.  He vapes every day.  He occasionally uses marijuana as well.  Family history of mental health unknown, citing that this is not discussed at home.     ROS: Per HPI  Allergies  Allergen Reactions  . Bee Venom    Past Medical History:  Diagnosis Date  . Allergy   . Asthma   . Murmur, heart    Mom states followed at [facility] in [place] for 1st year of life due to murmur    Current Outpatient Medications:  .  citalopram (CELEXA) 40 MG tablet, Take 1 tablet (40 mg total) by mouth daily., Disp: 90 tablet, Rfl: 1 Social History   Socioeconomic History  . Marital status: Single    Spouse name: Not on file  . Number of children: Not on file  . Years of education: Not on file  . Highest education level: Not on file  Occupational History  . Not on file  Social Needs  . Financial  resource strain: Not on file  . Food insecurity:    Worry: Not on file    Inability: Not on file  . Transportation needs:    Medical: Not on file    Non-medical: Not on file  Tobacco Use  . Smoking status: Never Smoker  . Smokeless tobacco: Never Used  Substance and Sexual Activity  . Alcohol use: No  . Drug use: No  . Sexual activity: Not on file  Lifestyle  . Physical activity:    Days per week: Not on file    Minutes per session: Not on file  . Stress: Not on file  Relationships  . Social connections:    Talks on phone: Not on file    Gets together: Not on file    Attends religious service: Not on file    Active member of club or organization: Not on file    Attends meetings of clubs or organizations: Not on file    Relationship status: Not on file  . Intimate partner violence:    Fear of current or ex partner: Not on file    Emotionally abused: Not on file    Physically abused: Not on file    Forced sexual activity: Not on file  Other Topics Concern  . Not on file  Social History Narrative  . Not on file  No family history on file.  Objective: Office vital signs reviewed. BP 121/79   Pulse (!) 50   Temp 98.2 F (36.8 C) (Oral)   Ht _0  (1.651 m)   Wt 175 lb (79.4 kg)   BMI 29.12 kg/m   Physical Examination:  General: Awake, alert, well nourished, No acute distress HEENT: Normal    Eyes: PERRLA, extraocular membranes intact, no exophthalmos Cardio: Slightly bradycardic with regular rhythm, S1S2 heard, no murmurs appreciated Pulm: clear to auscultation bilaterally, no wheezes, rhonchi or rales; normal work of breathing on room air Skin: dry; intact; no rashes or lesions Neuro: No focal neurologic deficits Psych: Mood depressed, affect appropriate, eye contact poor, does not appear to be responding to internal stimuli.  Very introverted during exam.  Depression screen [facility] 2/9 12/28/2017 08/27/2017 04/27/2017  Decreased Interest 2 2 0  Down, Depressed,  Hopeless 3 2 0  PHQ - 2 Score 5 4 0  Altered sleeping 3 2 -  Tired, decreased energy 2 2 -  Change in appetite 2 0 -  Feeling bad or failure about yourself  2 1 -  Trouble concentrating 2 3 -  Moving slowly or fidgety/restless 1 1 -  Suicidal thoughts 2 0 -  PHQ-9 Score 19 13 -  Difficult doing work/chores Somewhat difficult - -   GAD 7 : Generalized Anxiety Score 12/28/2017 07/07/2016 12/03/2015 10/12/2015  Nervous, Anxious, on Edge _1 Control/stop worrying _2 Worry too much - different things _3 Trouble relaxing _4 Restless _5 Easily annoyed or irritable _6 Afraid - awful might happen _7 Total GAD 7 Score _8 Anxiety Difficulty Somewhat difficult Very difficult Somewhat difficult Somewhat difficult    Assessment/ Plan: 22 y.o. male   1. Mood disorder ([place]) He is certainly depressed on exam today.  PHQ 9 score of 19 with a gad 7 score of 16.  While he does score 2 for suicidal thoughts, he has no plan or intent.  I do think that he is safe to go home.  However, I do not think that his symptoms are adequately treated with just Celexa 40 mg.  We discussed possibly adding Wellbutrin today.  However, since sleep seems to be a main driver of symptoms we will add trazodone and see if perhaps symptoms may improve with improved sleep.  He will follow-up with me in 4 weeks for recheck.  We will consider adding Wellbutrin at that time if needed.  I have also placed a referral to the virtual behavioral health specialist.  He is amenable to speaking with somebody over the phone.  - CMP14+EGFR  2. Sleep disturbance Start trazodone 25 to 50 mg nightly as needed sleep.  I encouraged him to start with 25 mg for 1 week.  If no significant improvement may increase to 50 mg nightly - CMP14+EGFR - TSH  3. Screening Will obtain screening per his request - RPR - HIV antibody (with reflex) - Chlamydia/Gonococcus/Trichomonas, NAA  4. Screening for  thyroid disorder - TSH  5. Screening, anemia, deficiency, iron - CBC with Differential   Orders Placed This Encounter  Procedures  . Chlamydia/Gonococcus/Trichomonas, NAA  . CMP14+EGFR  . CBC with Differential  . TSH  . RPR  . HIV antibody (with reflex)   Meds ordered this encounter  Medications  . traZODone (DESYREL)  50 MG tablet    Sig: Take 0.5-1 tablets (25-50 mg total) by mouth at bedtime as needed for sleep.    Dispense:  30 tablet    Refill:  [place], [place] [phone]

## 2017-12-29 LAB — CMP14+EGFR
ALT: 15 IU/L (ref 0–44)
AST: 18 IU/L (ref 0–40)
Albumin/Globulin Ratio: 2.1 (ref 1.2–2.2)
Albumin: 4.6 g/dL (ref 3.5–5.5)
Alkaline Phosphatase: 57 IU/L (ref 39–117)
BUN / CREAT RATIO: 13 (ref 9–20)
BUN: 13 mg/dL (ref 6–20)
Bilirubin Total: 0.6 mg/dL (ref 0.0–1.2)
CO2: 25 mmol/L (ref 20–29)
CREATININE: 1 mg/dL (ref 0.76–1.27)
Calcium: 9.5 mg/dL (ref 8.7–10.2)
Chloride: 102 mmol/L (ref 96–106)
GFR calc non Af Amer: 107 mL/min/{1.73_m2} (ref >59)
GFR, EST AFRICAN AMERICAN: 124 mL/min/{1.73_m2} (ref >59)
GLUCOSE: 83 mg/dL (ref 65–99)
Globulin, Total: 2.2 g/dL (ref 1.5–4.5)
Potassium: 3.9 mmol/L (ref 3.5–5.2)
Sodium: 141 mmol/L (ref 134–144)
TOTAL PROTEIN: 6.8 g/dL (ref 6.0–8.5)

## 2017-12-29 LAB — CBC WITH DIFFERENTIAL/PLATELET
Basophils Absolute: 0.1 10*3/uL (ref 0.0–0.2)
Basos: 1 %
EOS (ABSOLUTE): 0.2 10*3/uL (ref 0.0–0.4)
EOS: 5 %
HEMATOCRIT: 45.6 % (ref 37.5–51.0)
HEMOGLOBIN: 15.6 g/dL (ref 13.0–17.7)
IMMATURE GRANS (ABS): 0 10*3/uL (ref 0.0–0.1)
Immature Granulocytes: 0 %
LYMPHS ABS: 1.9 10*3/uL (ref 0.7–3.1)
LYMPHS: 35 %
MCH: 28.6 pg (ref 26.6–33.0)
MCHC: 34.2 g/dL (ref 31.5–35.7)
MCV: 84 fL (ref 79–97)
MONOCYTES: 6 %
Monocytes Absolute: 0.3 10*3/uL (ref 0.1–0.9)
Neutrophils Absolute: 2.8 10*3/uL (ref 1.4–7.0)
Neutrophils: 53 %
Platelets: 233 10*3/uL (ref 150–450)
RBC: 5.46 x10E6/uL (ref 4.14–5.80)
RDW: 13.1 % (ref 12.3–15.4)
WBC: 5.3 10*3/uL (ref 3.4–10.8)

## 2017-12-29 LAB — RPR: RPR Ser Ql: NONREACTIVE

## 2017-12-29 LAB — HIV ANTIBODY (ROUTINE TESTING W REFLEX): HIV SCREEN 4TH GENERATION: NONREACTIVE

## 2017-12-29 LAB — TSH: TSH: 1.91 u[IU]/mL (ref 0.450–4.500)

## 2017-12-30 LAB — CHLAMYDIA/GONOCOCCUS/TRICHOMONAS, NAA
Chlamydia by NAA: NEGATIVE
Gonococcus by NAA: NEGATIVE
Trich vag by NAA: NEGATIVE

## 2017-12-31 ENCOUNTER — Telehealth: Payer: Self-pay

## 2017-12-31 NOTE — Telephone Encounter (Signed)
[facility] - Left Msg  

## 2018-01-11 ENCOUNTER — Telehealth: Payer: Self-pay

## 2018-01-11 NOTE — Telephone Encounter (Signed)
This patient has a PHQ score of 19. When the patient comes in for a office visit, [facility] will be able to see the patient on the Telepsych machine if services are still needed.   Several attempts have been made to contact patient without success. Patient will be placed on the inactive list.  If services are needed again.  Please contact [facility] at [phone].    Information will be routed to the PCP and Dr. Vanetta Shawl

## 2018-01-19 ENCOUNTER — Other Ambulatory Visit: Payer: Self-pay | Admitting: Family Medicine

## 2018-01-21 NOTE — Telephone Encounter (Signed)
Last seen 12/28/17  Dr Reece Agar

## 2018-01-25 ENCOUNTER — Ambulatory Visit: Payer: BLUE CROSS/BLUE SHIELD | Admitting: Family Medicine

## 2018-06-14 DIAGNOSIS — Z23 Encounter for immunization: Secondary | ICD-10-CM | POA: Diagnosis not present

## 2018-06-14 DIAGNOSIS — S61011A Laceration without foreign body of right thumb without damage to nail, initial encounter: Secondary | ICD-10-CM | POA: Diagnosis not present

## 2018-06-24 DIAGNOSIS — Z4802 Encounter for removal of sutures: Secondary | ICD-10-CM | POA: Diagnosis not present

## 2018-07-15 ENCOUNTER — Other Ambulatory Visit: Payer: Self-pay

## 2018-07-15 MED ORDER — CITALOPRAM HYDROBROMIDE 40 MG PO TABS: 40.0000 mg | ORAL_TABLET | Freq: Every day | ORAL | 0 refills | Status: DC

## 2018-07-15 NOTE — Telephone Encounter (Signed)
Last office visit:   12/28/2017

## 2018-07-15 NOTE — Telephone Encounter (Signed)
Will refill.  Please place him on the schedule for an e-visit to follow up on mood and sleep.

## 2018-11-19 ENCOUNTER — Other Ambulatory Visit: Payer: Self-pay | Admitting: Family Medicine

## 2018-12-28 ENCOUNTER — Other Ambulatory Visit: Payer: Self-pay | Admitting: Family Medicine

## 2019-01-02 ENCOUNTER — Other Ambulatory Visit: Payer: Self-pay | Admitting: Family Medicine

## 2019-01-07 ENCOUNTER — Ambulatory Visit (INDEPENDENT_AMBULATORY_CARE_PROVIDER_SITE_OTHER): Payer: BC Managed Care – PPO | Admitting: Family Medicine

## 2019-01-07 DIAGNOSIS — F39 Unspecified mood [affective] disorder: Secondary | ICD-10-CM | POA: Diagnosis not present

## 2019-01-07 MED ORDER — CITALOPRAM HYDROBROMIDE 40 MG PO TABS: 40.0000 mg | ORAL_TABLET | Freq: Every day | ORAL | 0 refills | Status: DC

## 2019-01-07 NOTE — Progress Notes (Signed)
Telephone visit  Subjective: CC: f/u mood PCP: Janora Norlander, DO QIO:[id number] Todd Sparks is a 23 y.o. male calls for telephone consult today. Patient provides verbal consent for consult held via phone.  Location of patient: home Location of provider: Working remotely from home Others present for call: none  1. Mood disorder Since our last visit he has moved to [place].  He is seeing a therapist in [place].  He meets with them once monthly. He has been out of medication for 2 weeks.  He notes that he continues to have some financial stressors due to COVID-19 and having lost a job.  He is currently working full-time though.  In the last couple of weeks he feels that his symptoms have certainly gotten exacerbated and that he had prior to 2 weeks ago been fairly stable.  He is having issues with both anxiety and depression currently.  He has been having problems with fatigue, increased worrying and irritability.   ROS: Per HPI  Allergies  Allergen Reactions  . Bee Venom    Past Medical History:  Diagnosis Date  . Allergy   . Asthma   . Murmur, heart    Mom states followed at [facility] in [place] for 1st year of life due to murmur    Current Outpatient Medications:  .  citalopram (CELEXA) 40 MG tablet, Take 1 tablet (40 mg total) by mouth daily., Disp: 90 tablet, Rfl: 0  Depression screen [facility] 2/9 01/07/2019 12/28/2017 08/27/2017  Decreased Interest 2 2 2   Down, Depressed, Hopeless 2 3 2   PHQ - 2 Score 4 5 4   Altered sleeping 0 3 2  Tired, decreased energy 3 2 2   Change in appetite 0 2 0  Feeling bad or failure about yourself  2 2 1   Trouble concentrating 3 2 3   Moving slowly or fidgety/restless 3 1 1   Suicidal thoughts 0 2 0  PHQ-9 Score 15 19 13   Difficult doing work/chores Very difficult Somewhat difficult -   GAD 7 : Generalized Anxiety Score 01/07/2019 12/28/2017 07/07/2016 12/03/2015  Nervous, Anxious, on Edge 3 2 3 3   Control/stop worrying 2 3 3 3    Worry too much - different things 2 3 3 3   Trouble relaxing 2 2 2 2   Restless 2 2 3 2   Easily annoyed or irritable 2 2 3 2   Afraid - awful might happen 2 2 3 2   Total GAD 7 Score 15 16 20 17   Anxiety Difficulty Very difficult Somewhat difficult Very difficult Somewhat difficult   Assessment/ Plan: 23 y.o. male   1. Mood disorder ([place]) Not controlled.  Likely due to lab/withdrawal from medication.  I have reinitiated his medication and would like to see him back in about 6 weeks.  We have scheduled this tele-visit over the phone today and he is aware of date and time.  May need to consider adjunct of medication if symptoms or not well controlled.  Continue counseling services. - citalopram (CELEXA) 40 MG tablet; Take 1 tablet (40 mg total) by mouth daily.  Dispense: 90 tablet; Refill: 0   Start time: 9:40am End time: 9:50am  Total time spent on patient care (including telephone call/ virtual visit): 15 minutes  [place], [place] [phone]

## 2019-02-25 ENCOUNTER — Ambulatory Visit (INDEPENDENT_AMBULATORY_CARE_PROVIDER_SITE_OTHER): Payer: BC Managed Care – PPO | Admitting: Family Medicine

## 2019-02-25 DIAGNOSIS — Z5329 Procedure and treatment not carried out because of patient's decision for other reasons: Secondary | ICD-10-CM

## 2019-02-25 NOTE — Progress Notes (Signed)
Telephone visit  No show  Start time: 10:32 am (LVM)   Janora Norlander, [place] [phone]

## 2019-03-19 ENCOUNTER — Ambulatory Visit (INDEPENDENT_AMBULATORY_CARE_PROVIDER_SITE_OTHER): Payer: BC Managed Care – PPO | Admitting: Family Medicine

## 2019-03-19 DIAGNOSIS — Z5329 Procedure and treatment not carried out because of patient's decision for other reasons: Secondary | ICD-10-CM
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
## 2019-04-30 ENCOUNTER — Ambulatory Visit (INDEPENDENT_AMBULATORY_CARE_PROVIDER_SITE_OTHER): Payer: BC Managed Care – PPO | Admitting: Family Medicine

## 2019-04-30 ENCOUNTER — Encounter: Payer: Self-pay | Admitting: Family Medicine

## 2019-04-30 DIAGNOSIS — R41840 Attention and concentration deficit: Secondary | ICD-10-CM | POA: Diagnosis not present

## 2019-04-30 DIAGNOSIS — F39 Unspecified mood [affective] disorder: Secondary | ICD-10-CM

## 2019-04-30 MED ORDER — BUPROPION HCL ER (XL) 150 MG PO TB24: 150.0000 mg | ORAL_TABLET | Freq: Every day | ORAL | 1 refills | Status: DC

## 2019-04-30 MED ORDER — CITALOPRAM HYDROBROMIDE 40 MG PO TABS: 40.0000 mg | ORAL_TABLET | Freq: Every day | ORAL | 3 refills | Status: DC
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
## 2019-11-16 ENCOUNTER — Other Ambulatory Visit: Payer: Self-pay | Admitting: Family Medicine

## 2019-11-16 DIAGNOSIS — R41840 Attention and concentration deficit: Secondary | ICD-10-CM

## 2019-11-16 DIAGNOSIS — F39 Unspecified mood [affective] disorder: Secondary | ICD-10-CM

## 2019-12-01 ENCOUNTER — Other Ambulatory Visit: Payer: Self-pay | Admitting: Family Medicine

## 2019-12-01 DIAGNOSIS — R41840 Attention and concentration deficit: Secondary | ICD-10-CM

## 2019-12-01 DIAGNOSIS — F39 Unspecified mood [affective] disorder: Secondary | ICD-10-CM

## 2020-06-03 ENCOUNTER — Other Ambulatory Visit: Payer: Self-pay | Admitting: Family Medicine

## 2020-06-03 DIAGNOSIS — F39 Unspecified mood [affective] disorder: Secondary | ICD-10-CM

## 2020-07-13 ENCOUNTER — Ambulatory Visit (INDEPENDENT_AMBULATORY_CARE_PROVIDER_SITE_OTHER): Payer: BC Managed Care – PPO | Admitting: Family Medicine

## 2020-07-13 ENCOUNTER — Encounter: Payer: Self-pay | Admitting: Family Medicine

## 2020-07-13 ENCOUNTER — Other Ambulatory Visit: Payer: Self-pay

## 2020-07-13 VITALS — BP 127/81 | HR 55 | Temp 97.4°F | Ht 65.0 in | Wt 184.8 lb

## 2020-07-13 DIAGNOSIS — F39 Unspecified mood [affective] disorder: Secondary | ICD-10-CM

## 2020-07-13 DIAGNOSIS — R41840 Attention and concentration deficit: Secondary | ICD-10-CM | POA: Diagnosis not present

## 2020-07-13 MED ORDER — CITALOPRAM HYDROBROMIDE 40 MG PO TABS: 40.0000 mg | ORAL_TABLET | Freq: Every day | ORAL | 3 refills | Status: AC

## 2020-07-13 MED ORDER — CITALOPRAM HYDROBROMIDE 40 MG PO TABS: 40.0000 mg | ORAL_TABLET | Freq: Every day | ORAL | 3 refills | Status: DC

## 2020-07-13 NOTE — Progress Notes (Signed)
Subjective: CC: Mood disorder PCP: Raliegh Ip, DO [id number] [place] is a 25 y.o. male presenting to clinic today for:  1. Mood disorder Patient's last an office evaluation was September 2019.  He was evaluated via telephone last in January 2021.  At that time, he had recently lost his best friend and started seeing a counselor in [place] as a result.   He was given a 34-month supply of Wellbutrin during our last visit but unfortunately did not follow-up as recommended.  He is here today for checkup on anxiety and depression.   He notes that things have been okay since her last visit.  He had discontinued seeing his counselor but just started pursuing counseling services again with a different counselor yesterday.  He has continued on the Celexa and found this to be more beneficial than the Celexa plus Wellbutrin together.  He felt that the Wellbutrin caused him to feel stimulated but fatigued at the same time.  He did not see a huge difference in his concentration.  He continues to have some difficulty with focus but overall feels stable.  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  He is currently trying to make plans to move to [facility] with his girlfriend.  She teaches and goes to school there.  He is currently residing in [place] now.   ROS: Per HPI  Allergies  Allergen Reactions  . Bee Venom    Past Medical History:  Diagnosis Date  . Allergy   . Asthma   . Murmur, heart    Mom states followed at [facility] in [place] for 1st year of life due to murmur    Current Outpatient Medications:  .  citalopram (CELEXA) 40 MG tablet, Take 1 tablet (40 mg total) by mouth daily., Disp: 90 tablet, Rfl: 3 Social History   Socioeconomic History  . Marital status: Single    Spouse name: Not on file  . Number of children: Not on file  . Years of education: Not on file  . Highest  education level: Not on file  Occupational History  . Occupation: [employer]  Tobacco Use  . Smoking status: Never Smoker  . Smokeless tobacco: Never Used  Substance and Sexual Activity  . Alcohol use: No  . Drug use: No  . Sexual activity: Not on file  Other Topics Concern  . Not on file  Social History Narrative  . Not on file   Social Determinants of Health   Financial Resource Strain: Not on file  Food Insecurity: Not on file  Transportation Needs: Not on file  Physical Activity: Not on file  Stress: Not on file  Social Connections: Not on file  Intimate Partner Violence: Not on file   History reviewed. No pertinent family history.  Objective: Office vital signs reviewed. BP 127/81   Pulse (!) 55   Temp (!) 97.4 F (36.3 C) (Temporal)   Ht 5\' 5"  (1.651 m)   Wt 184 lb 12.8 oz (83.8 kg)   SpO2 99%   BMI 30.75 kg/m   Physical Examination:  General: Awake, alert, No acute distress HEENT: Normal; PERRLA, EOMI, sclera white Cardio: Slightly bradycardic with regular rhythm, S1S2 heard, no murmurs appreciated Pulm: clear to auscultation bilaterally, no wheezes, rhonchi or rales; normal work of breathing on room air Psych: Somewhat anxious but patient is pleasant and interactive.  Thought process linear. Depression screen [facility] 2/9  07/13/2020 07/13/2020 04/30/2019  Decreased Interest 0 0 2  Down, Depressed, Hopeless 0 0 2  PHQ - 2 Score 0 0 4  Altered sleeping 2 - 0  Tired, decreased energy 0 - 1  Change in appetite 0 - 0  Feeling bad or failure about yourself  0 - 0  Trouble concentrating 2 - 1  Moving slowly or fidgety/restless 0 - 0  Suicidal thoughts 0 - 0  PHQ-9 Score 4 - 6  Difficult doing work/chores Not difficult at all - Not difficult at all  Some recent data might be hidden   GAD 7 : Generalized Anxiety Score 07/13/2020 04/30/2019 01/07/2019 12/28/2017  Nervous, Anxious, on Edge 1 2 3 2   Control/stop worrying 0 0 2 3  Worry too much -  different things 1 1 2 3   Trouble relaxing 0 0 2 2  Restless 0 2 2 2   Easily annoyed or irritable 0 0 2 2  Afraid - awful might happen 0 0 2 2  Total GAD 7 Score 2 5 15 16   Anxiety Difficulty Not difficult at all Not difficult at all Very difficult Somewhat difficult     Assessment/ Plan: 25 y.o. male   Mood disorder (HCC) - Plan: citalopram (CELEXA) 40 MG tablet, DISCONTINUED: citalopram (CELEXA) 40 MG tablet  Concentration deficit  Mood disorder is stable with Celexa.  Wellbutrin did not help with concentration deficit but in fact aggravated other symptoms and therefore that will not be renewed.  He may follow-up in 1 year for annual physical exam and medication renewal, sooner if needed  No orders of the defined types were placed in this encounter.  No orders of the defined types were placed in this encounter.    , DO [facility] [phone]

## 2021-07-14 ENCOUNTER — Encounter: Payer: Self-pay | Admitting: Family Medicine

## 2021-07-14 ENCOUNTER — Encounter: Payer: BC Managed Care – PPO | Admitting: Family Medicine

## 2021-08-30 ENCOUNTER — Other Ambulatory Visit: Payer: Self-pay | Admitting: Family Medicine

## 2021-08-30 DIAGNOSIS — F39 Unspecified mood [affective] disorder: Secondary | ICD-10-CM

## 2021-09-23 ENCOUNTER — Other Ambulatory Visit: Payer: Self-pay | Admitting: Family Medicine

## 2021-09-23 DIAGNOSIS — F39 Unspecified mood [affective] disorder: Secondary | ICD-10-CM

## 2021-10-18 DIAGNOSIS — F32A Depression, unspecified: Secondary | ICD-10-CM | POA: Diagnosis not present

## 2023-11-16 DIAGNOSIS — S0003XA Contusion of scalp, initial encounter: Secondary | ICD-10-CM | POA: Diagnosis not present

## 2023-11-16 DIAGNOSIS — W2209XA Striking against other stationary object, initial encounter: Secondary | ICD-10-CM | POA: Diagnosis not present

## 2023-11-21 DIAGNOSIS — S0101XD Laceration without foreign body of scalp, subsequent encounter: Secondary | ICD-10-CM | POA: Diagnosis not present

## 2023-11-21 DIAGNOSIS — Z4802 Encounter for removal of sutures: Secondary | ICD-10-CM | POA: Diagnosis not present

## 2023-11-21 DIAGNOSIS — X58XXXD Exposure to other specified factors, subsequent encounter: Secondary | ICD-10-CM | POA: Diagnosis not present

## 2023-12-14 DIAGNOSIS — F902 Attention-deficit hyperactivity disorder, combined type: Secondary | ICD-10-CM | POA: Diagnosis not present

## 2023-12-14 DIAGNOSIS — F4481 Dissociative identity disorder: Secondary | ICD-10-CM | POA: Diagnosis not present

## 2023-12-14 DIAGNOSIS — F4312 Post-traumatic stress disorder, chronic: Secondary | ICD-10-CM | POA: Diagnosis not present

## 2023-12-21 DIAGNOSIS — F902 Attention-deficit hyperactivity disorder, combined type: Secondary | ICD-10-CM | POA: Diagnosis not present

## 2023-12-21 DIAGNOSIS — F4481 Dissociative identity disorder: Secondary | ICD-10-CM | POA: Diagnosis not present

## 2023-12-21 DIAGNOSIS — F4312 Post-traumatic stress disorder, chronic: Secondary | ICD-10-CM | POA: Diagnosis not present

## 2023-12-28 DIAGNOSIS — F4481 Dissociative identity disorder: Secondary | ICD-10-CM | POA: Diagnosis not present

## 2023-12-28 DIAGNOSIS — F902 Attention-deficit hyperactivity disorder, combined type: Secondary | ICD-10-CM | POA: Diagnosis not present

## 2023-12-28 DIAGNOSIS — F4312 Post-traumatic stress disorder, chronic: Secondary | ICD-10-CM | POA: Diagnosis not present

## 2024-01-04 DIAGNOSIS — F902 Attention-deficit hyperactivity disorder, combined type: Secondary | ICD-10-CM | POA: Diagnosis not present

## 2024-01-04 DIAGNOSIS — F4481 Dissociative identity disorder: Secondary | ICD-10-CM | POA: Diagnosis not present

## 2024-01-04 DIAGNOSIS — F4312 Post-traumatic stress disorder, chronic: Secondary | ICD-10-CM | POA: Diagnosis not present

## 2024-01-31 DIAGNOSIS — F4312 Post-traumatic stress disorder, chronic: Secondary | ICD-10-CM | POA: Diagnosis not present

## 2024-01-31 DIAGNOSIS — F4481 Dissociative identity disorder: Secondary | ICD-10-CM | POA: Diagnosis not present

## 2024-01-31 DIAGNOSIS — F902 Attention-deficit hyperactivity disorder, combined type: Secondary | ICD-10-CM | POA: Diagnosis not present

## 2024-02-07 DIAGNOSIS — F902 Attention-deficit hyperactivity disorder, combined type: Secondary | ICD-10-CM | POA: Diagnosis not present

## 2024-02-07 DIAGNOSIS — F4312 Post-traumatic stress disorder, chronic: Secondary | ICD-10-CM | POA: Diagnosis not present

## 2024-02-07 DIAGNOSIS — F4481 Dissociative identity disorder: Secondary | ICD-10-CM | POA: Diagnosis not present

## 2024-02-14 DIAGNOSIS — F4481 Dissociative identity disorder: Secondary | ICD-10-CM | POA: Diagnosis not present

## 2024-02-14 DIAGNOSIS — F902 Attention-deficit hyperactivity disorder, combined type: Secondary | ICD-10-CM | POA: Diagnosis not present

## 2024-02-14 DIAGNOSIS — F4312 Post-traumatic stress disorder, chronic: Secondary | ICD-10-CM | POA: Diagnosis not present

## 2024-02-21 DIAGNOSIS — F4312 Post-traumatic stress disorder, chronic: Secondary | ICD-10-CM | POA: Diagnosis not present

## 2024-02-21 DIAGNOSIS — F902 Attention-deficit hyperactivity disorder, combined type: Secondary | ICD-10-CM | POA: Diagnosis not present

## 2024-02-21 DIAGNOSIS — F4481 Dissociative identity disorder: Secondary | ICD-10-CM | POA: Diagnosis not present

## 2024-02-28 DIAGNOSIS — F4312 Post-traumatic stress disorder, chronic: Secondary | ICD-10-CM | POA: Diagnosis not present

## 2024-02-28 DIAGNOSIS — F902 Attention-deficit hyperactivity disorder, combined type: Secondary | ICD-10-CM | POA: Diagnosis not present

## 2024-02-28 DIAGNOSIS — F4481 Dissociative identity disorder: Secondary | ICD-10-CM | POA: Diagnosis not present

## 2024-03-06 DIAGNOSIS — F4312 Post-traumatic stress disorder, chronic: Secondary | ICD-10-CM | POA: Diagnosis not present

## 2024-03-06 DIAGNOSIS — F902 Attention-deficit hyperactivity disorder, combined type: Secondary | ICD-10-CM | POA: Diagnosis not present

## 2024-03-11 DIAGNOSIS — F4312 Post-traumatic stress disorder, chronic: Secondary | ICD-10-CM | POA: Diagnosis not present

## 2024-03-11 DIAGNOSIS — F4481 Dissociative identity disorder: Secondary | ICD-10-CM | POA: Diagnosis not present

## 2024-03-11 DIAGNOSIS — F902 Attention-deficit hyperactivity disorder, combined type: Secondary | ICD-10-CM | POA: Diagnosis not present

## 2024-03-20 DIAGNOSIS — F4312 Post-traumatic stress disorder, chronic: Secondary | ICD-10-CM | POA: Diagnosis not present

## 2024-03-20 DIAGNOSIS — F902 Attention-deficit hyperactivity disorder, combined type: Secondary | ICD-10-CM | POA: Diagnosis not present

## 2024-04-03 DIAGNOSIS — F902 Attention-deficit hyperactivity disorder, combined type: Secondary | ICD-10-CM | POA: Diagnosis not present

## 2024-04-03 DIAGNOSIS — F4312 Post-traumatic stress disorder, chronic: Secondary | ICD-10-CM | POA: Diagnosis not present

## 2024-04-08 DIAGNOSIS — F4312 Post-traumatic stress disorder, chronic: Secondary | ICD-10-CM | POA: Diagnosis not present

## 2024-04-08 DIAGNOSIS — F902 Attention-deficit hyperactivity disorder, combined type: Secondary | ICD-10-CM | POA: Diagnosis not present
